# Patient Record
Sex: Female | Born: 1955 | Race: White | Hispanic: No | Marital: Single | Smoking: Current every day smoker
Health system: Southern US, Community
[De-identification: ages and names within clinical notes are randomized; demographics above are authoritative.]

## PROBLEM LIST (undated history)

## (undated) DIAGNOSIS — T7840XA Allergy, unspecified, initial encounter: Secondary | ICD-10-CM

## (undated) DIAGNOSIS — Z9189 Other specified personal risk factors, not elsewhere classified: Secondary | ICD-10-CM

## (undated) DIAGNOSIS — F32A Depression, unspecified: Secondary | ICD-10-CM

## (undated) DIAGNOSIS — K219 Gastro-esophageal reflux disease without esophagitis: Secondary | ICD-10-CM

## (undated) HISTORY — DX: Allergy, unspecified, initial encounter: T78.40XA

## (undated) HISTORY — DX: Other specified personal risk factors, not elsewhere classified: Z91.89

## (undated) HISTORY — DX: Gastro-esophageal reflux disease without esophagitis: K21.9

## (undated) HISTORY — PX: NO PAST SURGERIES: SHX2092

## (undated) HISTORY — DX: Depression, unspecified: F32.A

---

## 1999-07-31 ENCOUNTER — Emergency Department (HOSPITAL_COMMUNITY): Admission: EM | Admit: 1999-07-31 | Discharge: 1999-07-31 | Payer: Self-pay | Admitting: Emergency Medicine

## 1999-10-09 ENCOUNTER — Emergency Department (HOSPITAL_COMMUNITY): Admission: EM | Admit: 1999-10-09 | Discharge: 1999-10-09 | Payer: Self-pay | Admitting: Emergency Medicine

## 2007-08-02 ENCOUNTER — Encounter: Admission: RE | Admit: 2007-08-02 | Discharge: 2007-08-02 | Payer: Self-pay | Admitting: Orthopedic Surgery

## 2009-02-11 ENCOUNTER — Emergency Department (HOSPITAL_COMMUNITY): Admission: EM | Admit: 2009-02-11 | Discharge: 2009-02-11 | Payer: Self-pay | Admitting: Emergency Medicine

## 2009-02-17 ENCOUNTER — Encounter: Admission: RE | Admit: 2009-02-17 | Discharge: 2009-02-17 | Payer: Self-pay | Admitting: Internal Medicine

## 2010-01-27 IMAGING — RF DG UGI W/ HIGH DENSITY W/KUB
19 of 24 series · 19 of 24 positions shown · non-contrast
Comparison: [HOSPITAL] chest x-ray 02/11/2009.

CLINICAL DATA: Abdominal pain. Bright blood in stool alternating
with very dark stool.  Constipation.  Post prandial chest
pain/pressure.  Weight loss 10-12 pounds in 1 month.  Nausea.

UPPER GI SERIES W/HIGH DENSITY W/KUB
TECHNIQUE: After obtaining a scout radiograph, upper GI series
performed with high density barium and effervescent agent. Thin
barium also used. Ingested 13 mm barium tablet with water.
Fluoroscopy Time: 2.7 minutes.

[Series 1: run · 1 of 5 slices shown (1 of 19)]
[im 1/5]
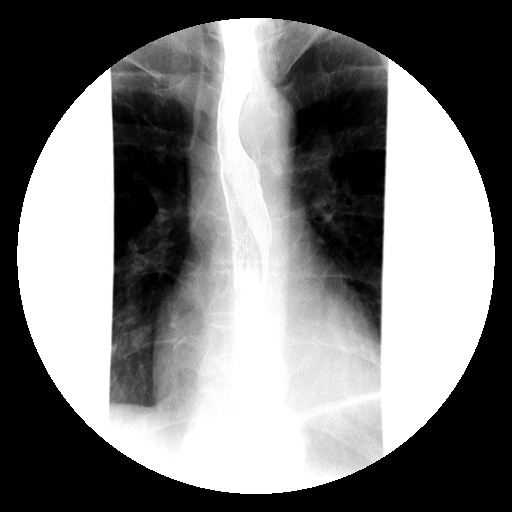

[Series 2: run · 1 of 3 slices shown (2 of 19)]
[im 1/3]
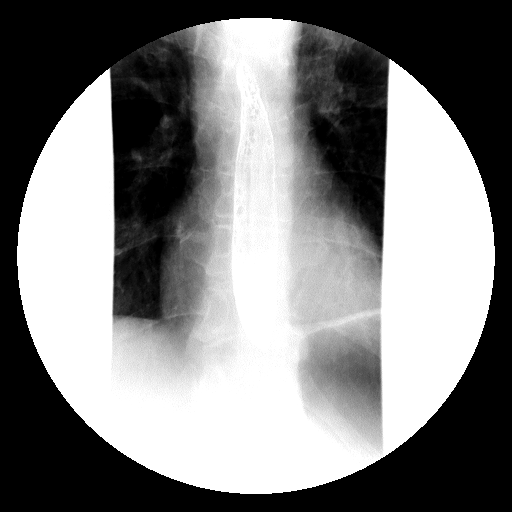

[Series 4: run · 1 of 3 slices shown (3 of 19)]
[im 1/3]
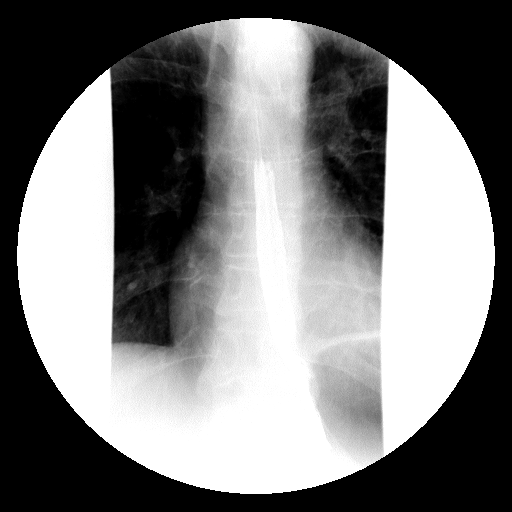

[Series 5: run · 1 of 1 slices shown (4 of 19)]
[im 1/1]
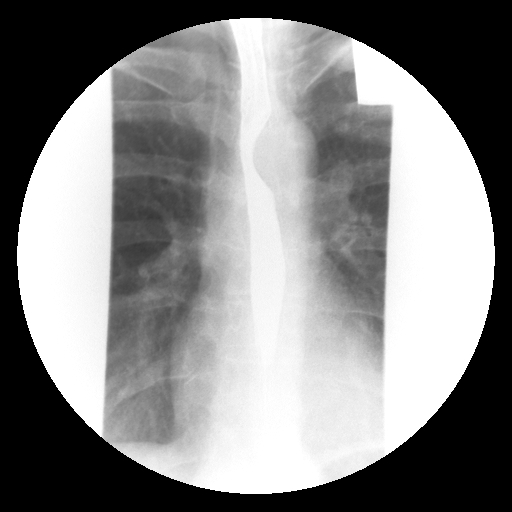

[Series 6: run · 1 of 1 slices shown (5 of 19)]
[im 1/1]
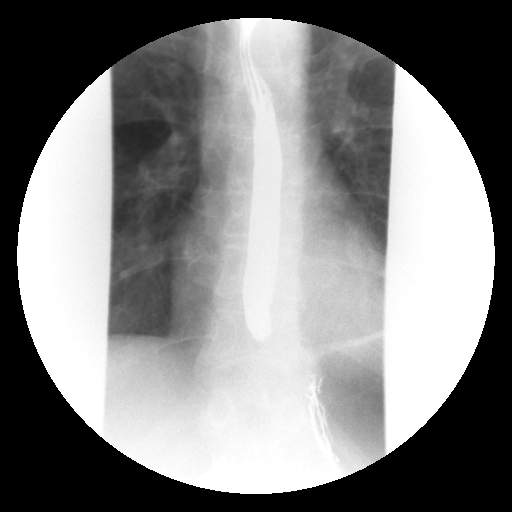

[Series 7: run · 1 of 1 slices shown (6 of 19)]
[im 1/1]
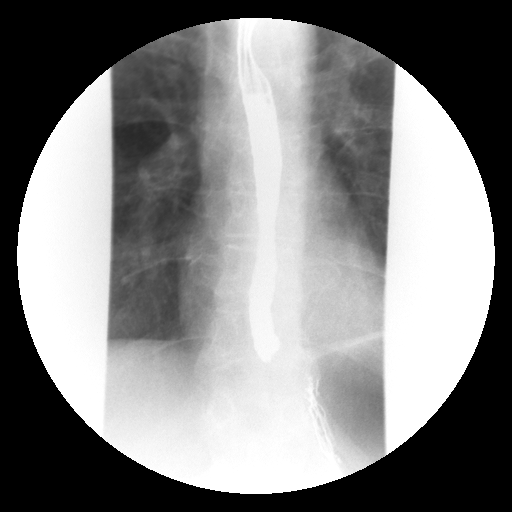

[Series 9: run · 1 of 7 slices shown (7 of 19)]
[im 1/7]
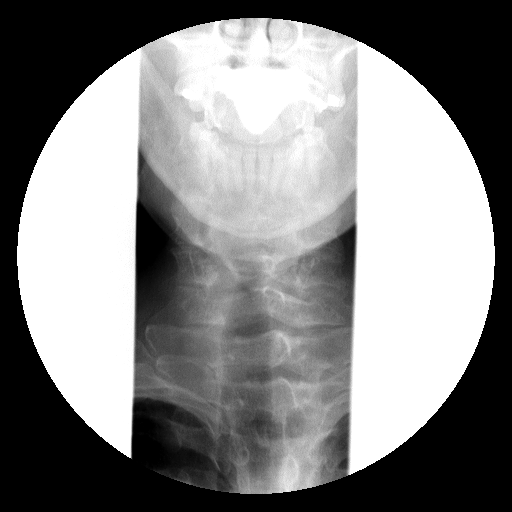

[Series 10: run · 1 of 15 slices shown (8 of 19)]
[im 1/15]
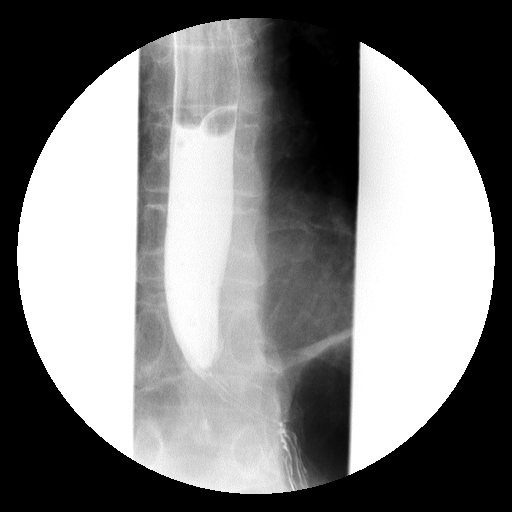

[Series 11: run · 1 of 10 slices shown (9 of 19)]
[im 1/10]
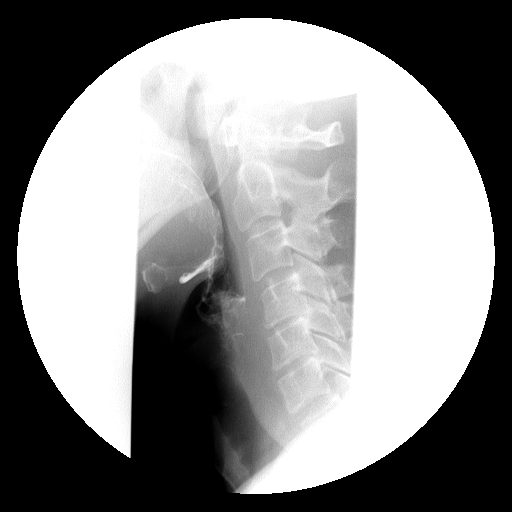

[Series 13: run · 1 of 5 slices shown (10 of 19)]
[im 1/5]
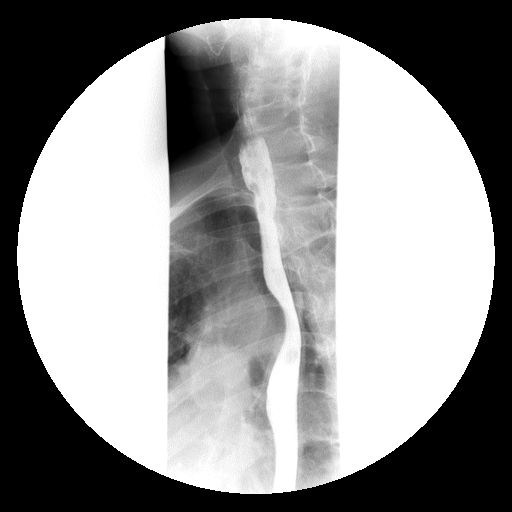

[Series 14: run · 1 of 3 slices shown (11 of 19)]
[im 1/3]
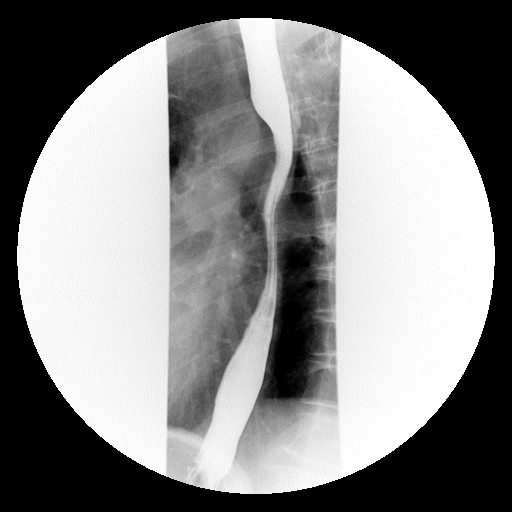

[Series 15: run · 1 of 22 slices shown (12 of 19)]
[im 1/22]
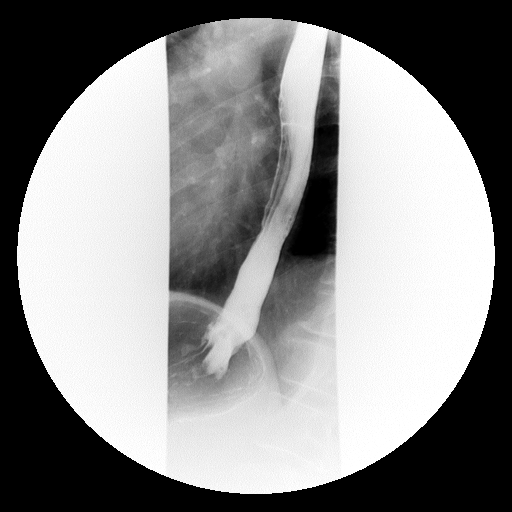

[Series 16: run · 1 of 1 slices shown (13 of 19)]
[im 1/1]
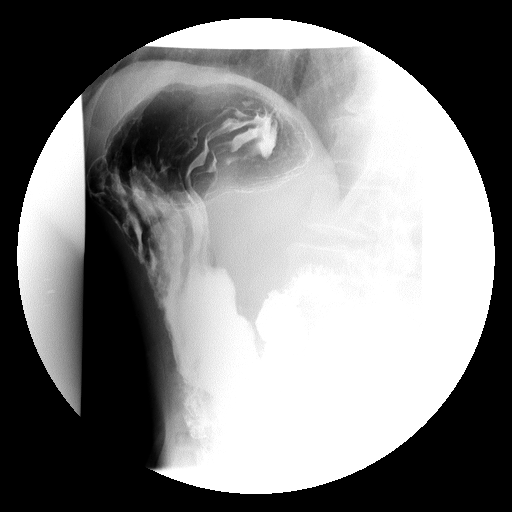

[Series 18: run · 1 of 1 slices shown (14 of 19)]
[im 1/1]
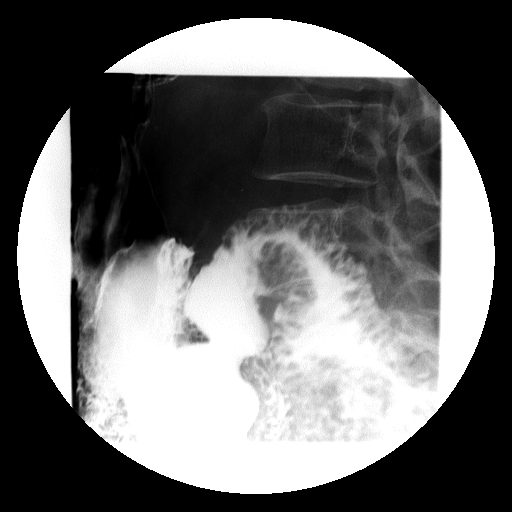

[Series 19: run · 1 of 1 slices shown (15 of 19)]
[im 1/1]
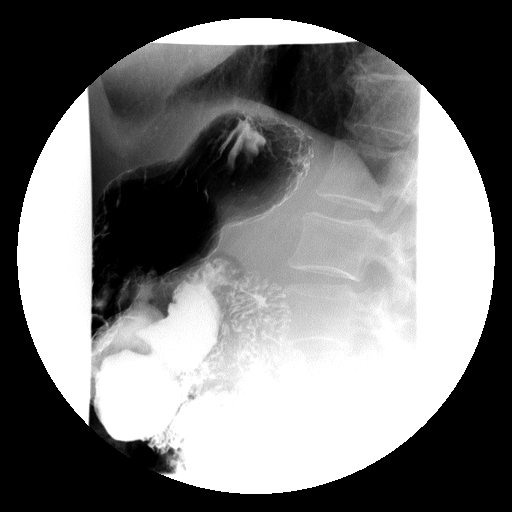

[Series 20: run · 1 of 1 slices shown (16 of 19)]
[im 1/1]
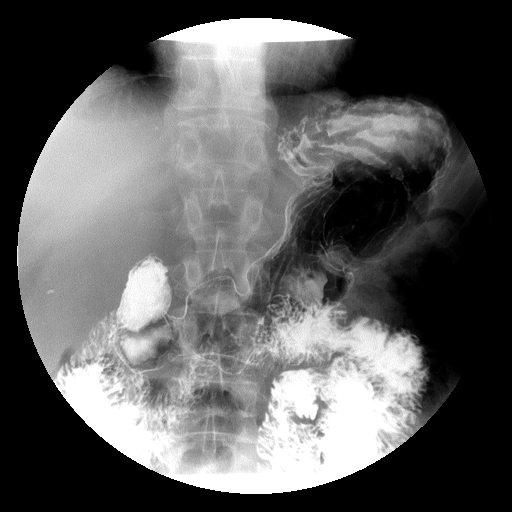

[Series 21: run · 1 of 1 slices shown (17 of 19)]
[im 1/1]
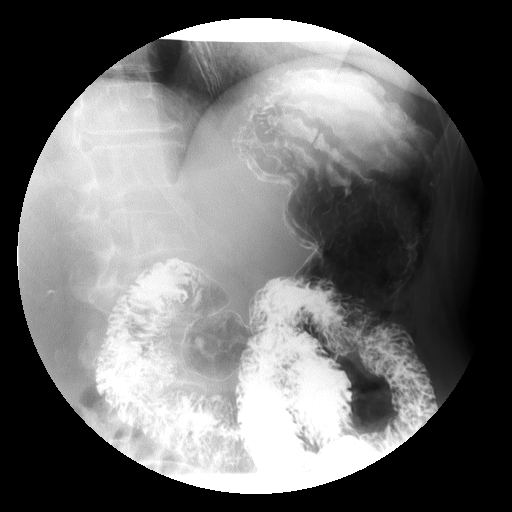

[Series 23: run · 1 of 1 slices shown (18 of 19)]
[im 1/1]
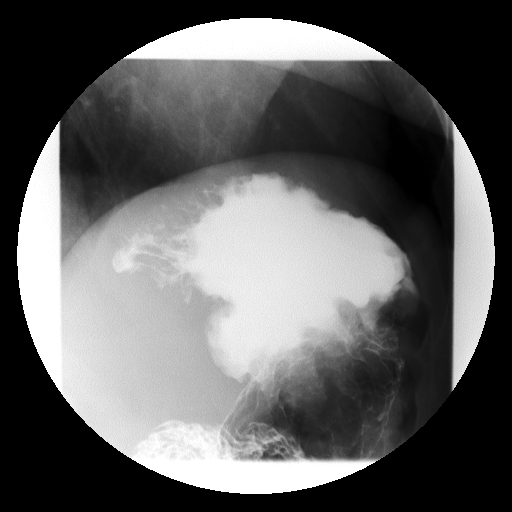

[Series 24: run · 1 of 1 slices shown (19 of 19)]
[im 1/1]
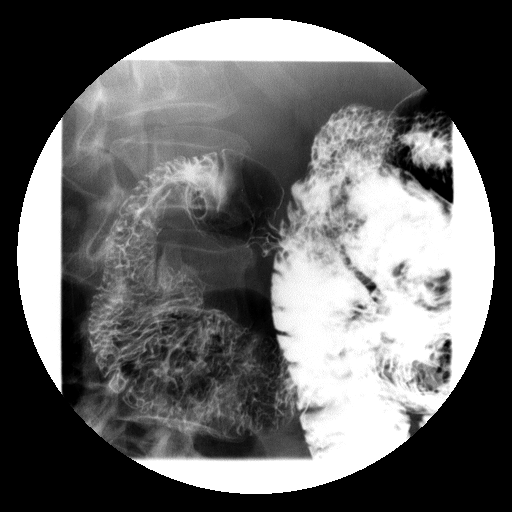

[19 of 24 positions shown; findings below may reference images not displayed]

FINDINGS: Normal antegrade peristalsis is seen through the cervical
thoracic esophagus with no intrinsic or extrinsic esophageal
lesion.  Ingested 13 mm barium tablet readily passed into the
stomach.  No spontaneous or induced (Valsalva/water siphon)
gastroesophageal reflux demonstrated.  The stomach, duodenum, and
proximal loops of jejunum appear normal with prompt egress of
barium through structures.  Moderate retained colonic feces
especially left colon seen with normal bowel gas pattern on scout
view.
IMPRESSION: 1.  Normal upper GI series.
2.  Moderate retained left colonic feces may represent slight
constipation.  Need clinical correlation.

## 2010-12-11 ENCOUNTER — Encounter: Payer: Self-pay | Admitting: Internal Medicine

## 2011-03-02 LAB — CBC
HCT: 40.6 % (ref 36.0–46.0)
MCHC: 34.5 g/dL (ref 30.0–36.0)
MCV: 99.9 fL (ref 78.0–100.0)
Platelets: 156 10*3/uL (ref 150–400)
WBC: 3.5 10*3/uL — ABNORMAL LOW (ref 4.0–10.5)

## 2011-03-02 LAB — DIFFERENTIAL
Basophils Relative: 1 % (ref 0–1)
Eosinophils Absolute: 0 10*3/uL (ref 0.0–0.7)
Eosinophils Relative: 1 % (ref 0–5)
Lymphs Abs: 1.4 10*3/uL (ref 0.7–4.0)
Neutrophils Relative %: 51 % (ref 43–77)

## 2011-03-02 LAB — COMPREHENSIVE METABOLIC PANEL
ALT: 13 U/L (ref 0–35)
AST: 18 U/L (ref 0–37)
Albumin: 3.6 g/dL (ref 3.5–5.2)
Alkaline Phosphatase: 46 U/L (ref 39–117)
CO2: 23 mEq/L (ref 19–32)
Chloride: 110 mEq/L (ref 96–112)
Creatinine, Ser: 1.11 mg/dL (ref 0.4–1.2)
GFR calc Af Amer: >60 mL/min (ref >60)
GFR calc non Af Amer: 52 mL/min — ABNORMAL LOW (ref >60)
Potassium: 4.3 mEq/L (ref 3.5–5.1)
Sodium: 141 mEq/L (ref 135–145)
Total Bilirubin: 0.3 mg/dL (ref 0.3–1.2)

## 2011-03-02 LAB — POCT CARDIAC MARKERS

## 2011-03-23 ENCOUNTER — Emergency Department (HOSPITAL_COMMUNITY)
Admission: EM | Admit: 2011-03-23 | Discharge: 2011-03-23 | Disposition: A | Discharge status: Home / Self Care | Payer: Self-pay | Attending: Emergency Medicine | Admitting: Emergency Medicine

## 2011-03-23 ENCOUNTER — Emergency Department (HOSPITAL_COMMUNITY): Payer: Self-pay

## 2011-03-23 DIAGNOSIS — R002 Palpitations: Secondary | ICD-10-CM | POA: Insufficient documentation

## 2011-03-23 DIAGNOSIS — R5381 Other malaise: Secondary | ICD-10-CM | POA: Insufficient documentation

## 2011-03-23 DIAGNOSIS — R64 Cachexia: Secondary | ICD-10-CM | POA: Insufficient documentation

## 2011-03-23 DIAGNOSIS — R634 Abnormal weight loss: Secondary | ICD-10-CM | POA: Insufficient documentation

## 2011-03-23 DIAGNOSIS — R109 Unspecified abdominal pain: Secondary | ICD-10-CM | POA: Insufficient documentation

## 2011-03-23 DIAGNOSIS — F329 Major depressive disorder, single episode, unspecified: Secondary | ICD-10-CM | POA: Insufficient documentation

## 2011-03-23 DIAGNOSIS — F17200 Nicotine dependence, unspecified, uncomplicated: Secondary | ICD-10-CM | POA: Insufficient documentation

## 2011-03-23 DIAGNOSIS — D696 Thrombocytopenia, unspecified: Secondary | ICD-10-CM | POA: Insufficient documentation

## 2011-03-23 DIAGNOSIS — K219 Gastro-esophageal reflux disease without esophagitis: Secondary | ICD-10-CM | POA: Insufficient documentation

## 2011-03-23 DIAGNOSIS — R5383 Other fatigue: Secondary | ICD-10-CM | POA: Insufficient documentation

## 2011-03-23 DIAGNOSIS — K589 Irritable bowel syndrome without diarrhea: Secondary | ICD-10-CM | POA: Insufficient documentation

## 2011-03-23 DIAGNOSIS — F3289 Other specified depressive episodes: Secondary | ICD-10-CM | POA: Insufficient documentation

## 2011-03-23 LAB — DIFFERENTIAL
Basophils Relative: 1 % (ref 0–1)
Eosinophils Absolute: 0.1 10*3/uL (ref 0.0–0.7)
Neutro Abs: 2 10*3/uL (ref 1.7–7.7)
Neutrophils Relative %: 45 % (ref 43–77)

## 2011-03-23 LAB — URINALYSIS, ROUTINE W REFLEX MICROSCOPIC
Glucose, UA: NEGATIVE mg/dL
Hgb urine dipstick: NEGATIVE
Ketones, ur: NEGATIVE mg/dL
pH: 6 (ref 5.0–8.0)

## 2011-03-23 LAB — URINE MICROSCOPIC-ADD ON

## 2011-03-23 LAB — COMPREHENSIVE METABOLIC PANEL
ALT: 10 U/L (ref 0–35)
AST: 17 U/L (ref 0–37)
Albumin: 3.7 g/dL (ref 3.5–5.2)
Alkaline Phosphatase: 36 U/L — ABNORMAL LOW (ref 39–117)
Chloride: 109 mEq/L (ref 96–112)
Creatinine, Ser: 1.08 mg/dL (ref 0.4–1.2)
GFR calc Af Amer: >60 mL/min (ref >60)
Potassium: 4 mEq/L (ref 3.5–5.1)
Sodium: 141 mEq/L (ref 135–145)
Total Bilirubin: 0.1 mg/dL — ABNORMAL LOW (ref 0.3–1.2)

## 2011-03-23 LAB — CBC
Platelets: 124 10*3/uL — ABNORMAL LOW (ref 150–400)
RBC: 3.91 MIL/uL (ref 3.87–5.11)
WBC: 4.3 10*3/uL (ref 4.0–10.5)

## 2011-05-09 ENCOUNTER — Emergency Department (HOSPITAL_COMMUNITY)
Admission: EM | Admit: 2011-05-09 | Discharge: 2011-05-10 | Disposition: A | Discharge status: Home / Self Care | Payer: Self-pay | Attending: Emergency Medicine | Admitting: Emergency Medicine

## 2011-05-09 DIAGNOSIS — F1320 Sedative, hypnotic or anxiolytic dependence, uncomplicated: Secondary | ICD-10-CM | POA: Insufficient documentation

## 2011-05-09 DIAGNOSIS — F341 Dysthymic disorder: Secondary | ICD-10-CM | POA: Insufficient documentation

## 2011-05-09 LAB — DIFFERENTIAL
Basophils Absolute: 0 10*3/uL (ref 0.0–0.1)
Eosinophils Absolute: 0.1 10*3/uL (ref 0.0–0.7)
Eosinophils Relative: 1 % (ref 0–5)
Lymphocytes Relative: 24 % (ref 12–46)
Monocytes Absolute: 0.3 10*3/uL (ref 0.1–1.0)

## 2011-05-09 LAB — COMPREHENSIVE METABOLIC PANEL
ALT: 12 U/L (ref 0–35)
AST: 17 U/L (ref 0–37)
CO2: 28 mEq/L (ref 19–32)
Calcium: 9.5 mg/dL (ref 8.4–10.5)
Chloride: 104 mEq/L (ref 96–112)
GFR calc Af Amer: >60 mL/min (ref >60)
GFR calc non Af Amer: 56 mL/min — ABNORMAL LOW (ref >60)
Glucose, Bld: 83 mg/dL (ref 70–99)
Sodium: 140 mEq/L (ref 135–145)
Total Bilirubin: 0.2 mg/dL — ABNORMAL LOW (ref 0.3–1.2)

## 2011-05-09 LAB — CBC
HCT: 40.5 % (ref 36.0–46.0)
MCH: 33.6 pg (ref 26.0–34.0)
MCV: 98.5 fL (ref 78.0–100.0)
Platelets: 144 10*3/uL — ABNORMAL LOW (ref 150–400)

## 2011-05-09 LAB — RAPID URINE DRUG SCREEN, HOSP PERFORMED
Barbiturates: NOT DETECTED
Cocaine: NOT DETECTED
Tetrahydrocannabinol: NOT DETECTED

## 2013-07-11 ENCOUNTER — Emergency Department (HOSPITAL_COMMUNITY): Payer: Self-pay

## 2013-07-11 ENCOUNTER — Encounter (HOSPITAL_COMMUNITY): Payer: Self-pay

## 2013-07-11 ENCOUNTER — Emergency Department (HOSPITAL_COMMUNITY)
Admission: EM | Admit: 2013-07-11 | Discharge: 2013-07-11 | Disposition: A | Discharge status: Home / Self Care | Payer: Self-pay | Attending: Emergency Medicine | Admitting: Emergency Medicine

## 2013-07-11 DIAGNOSIS — R296 Repeated falls: Secondary | ICD-10-CM | POA: Insufficient documentation

## 2013-07-11 DIAGNOSIS — Y9389 Activity, other specified: Secondary | ICD-10-CM | POA: Insufficient documentation

## 2013-07-11 DIAGNOSIS — Z880 Allergy status to penicillin: Secondary | ICD-10-CM | POA: Insufficient documentation

## 2013-07-11 DIAGNOSIS — Y9289 Other specified places as the place of occurrence of the external cause: Secondary | ICD-10-CM | POA: Insufficient documentation

## 2013-07-11 DIAGNOSIS — S42301A Unspecified fracture of shaft of humerus, right arm, initial encounter for closed fracture: Secondary | ICD-10-CM

## 2013-07-11 DIAGNOSIS — S42309A Unspecified fracture of shaft of humerus, unspecified arm, initial encounter for closed fracture: Secondary | ICD-10-CM | POA: Insufficient documentation

## 2013-07-11 NOTE — ED Provider Notes (Signed)
Medical screening examination/treatment/procedure(s) were performed by non-physician practitioner and as supervising physician I was immediately available for consultation/collaboration.  Danaisha Celli M Lenae Wherley, MD 07/11/13 2307 
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
## 2013-10-13 ENCOUNTER — Ambulatory Visit: Payer: Self-pay | Admitting: Physical Therapy

## 2013-10-29 ENCOUNTER — Ambulatory Visit: Payer: Self-pay | Attending: Orthopaedic Surgery | Admitting: Physical Therapy

## 2019-08-25 ENCOUNTER — Other Ambulatory Visit: Payer: Self-pay

## 2019-08-25 DIAGNOSIS — Z20822 Contact with and (suspected) exposure to covid-19: Secondary | ICD-10-CM

## 2019-08-27 LAB — NOVEL CORONAVIRUS, NAA: SARS-CoV-2, NAA: NOT DETECTED

## 2021-02-27 ENCOUNTER — Encounter (HOSPITAL_COMMUNITY): Payer: Self-pay | Admitting: Emergency Medicine

## 2021-02-27 ENCOUNTER — Other Ambulatory Visit: Payer: Self-pay

## 2021-02-27 ENCOUNTER — Emergency Department (HOSPITAL_COMMUNITY)
Admission: EM | Admit: 2021-02-27 | Discharge: 2021-02-27 | Disposition: A | Discharge status: Home / Self Care | Payer: No Typology Code available for payment source | Attending: Emergency Medicine | Admitting: Emergency Medicine

## 2021-02-27 ENCOUNTER — Emergency Department (HOSPITAL_COMMUNITY): Payer: No Typology Code available for payment source

## 2021-02-27 DIAGNOSIS — R911 Solitary pulmonary nodule: Secondary | ICD-10-CM | POA: Insufficient documentation

## 2021-02-27 DIAGNOSIS — F17210 Nicotine dependence, cigarettes, uncomplicated: Secondary | ICD-10-CM | POA: Diagnosis not present

## 2021-02-27 DIAGNOSIS — S060X0A Concussion without loss of consciousness, initial encounter: Secondary | ICD-10-CM | POA: Diagnosis not present

## 2021-02-27 DIAGNOSIS — S299XXA Unspecified injury of thorax, initial encounter: Secondary | ICD-10-CM | POA: Insufficient documentation

## 2021-02-27 DIAGNOSIS — Y92410 Unspecified street and highway as the place of occurrence of the external cause: Secondary | ICD-10-CM | POA: Diagnosis not present

## 2021-02-27 DIAGNOSIS — S0990XA Unspecified injury of head, initial encounter: Secondary | ICD-10-CM | POA: Diagnosis present

## 2021-02-27 LAB — CBC WITH DIFFERENTIAL/PLATELET
Abs Immature Granulocytes: 0.02 10*3/uL (ref 0.00–0.07)
Basophils Absolute: 0.1 10*3/uL (ref 0.0–0.1)
Basophils Relative: 1 %
Eosinophils Absolute: 0.1 10*3/uL (ref 0.0–0.5)
Eosinophils Relative: 3 %
HCT: 44.2 % (ref 36.0–46.0)
Hemoglobin: 14.6 g/dL (ref 12.0–15.0)
Immature Granulocytes: 0 %
Lymphocytes Relative: 25 %
Lymphs Abs: 1.2 10*3/uL (ref 0.7–4.0)
MCH: 33.3 pg (ref 26.0–34.0)
MCHC: 33 g/dL (ref 30.0–36.0)
MCV: 100.7 fL — ABNORMAL HIGH (ref 80.0–100.0)
Monocytes Absolute: 0.3 10*3/uL (ref 0.1–1.0)
Monocytes Relative: 6 %
Neutro Abs: 3.2 10*3/uL (ref 1.7–7.7)
Neutrophils Relative %: 65 %
Platelets: 156 10*3/uL (ref 150–400)
RBC: 4.39 MIL/uL (ref 3.87–5.11)
RDW: 13.6 % (ref 11.5–15.5)
WBC: 5 10*3/uL (ref 4.0–10.5)
nRBC: 0 % (ref 0.0–0.2)

## 2021-02-27 LAB — COMPREHENSIVE METABOLIC PANEL
ALT: 13 U/L (ref 0–44)
AST: 21 U/L (ref 15–41)
Albumin: 4 g/dL (ref 3.5–5.0)
Alkaline Phosphatase: 43 U/L (ref 38–126)
Anion gap: 10 (ref 5–15)
BUN: 13 mg/dL (ref 8–23)
CO2: 22 mmol/L (ref 22–32)
Calcium: 8.8 mg/dL — ABNORMAL LOW (ref 8.9–10.3)
Chloride: 108 mmol/L (ref 98–111)
Creatinine, Ser: 0.95 mg/dL (ref 0.44–1.00)
GFR, Estimated: >60 mL/min (ref >60)
Glucose, Bld: 86 mg/dL (ref 70–99)
Potassium: 3.8 mmol/L (ref 3.5–5.1)
Sodium: 140 mmol/L (ref 135–145)
Total Bilirubin: 0.4 mg/dL (ref 0.3–1.2)
Total Protein: 6.5 g/dL (ref 6.5–8.1)

## 2021-02-27 LAB — TROPONIN I (HIGH SENSITIVITY): Troponin I (High Sensitivity): 3 ng/L (ref <18)
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
## 2022-04-05 ENCOUNTER — Ambulatory Visit
Admission: RE | Admit: 2022-04-05 | Discharge: 2022-04-05 | Disposition: A | Discharge status: Home / Self Care | Payer: 59 | Source: Ambulatory Visit | Attending: Nurse Practitioner | Admitting: Nurse Practitioner

## 2022-04-05 ENCOUNTER — Ambulatory Visit: Payer: Self-pay

## 2022-04-05 VITALS — BP 106/61 | HR 83 | Temp 98.0°F | Resp 18

## 2022-04-05 DIAGNOSIS — J309 Allergic rhinitis, unspecified: Secondary | ICD-10-CM | POA: Diagnosis not present

## 2022-04-05 MED ORDER — ONDANSETRON 4 MG PO TBDP: 4.0000 mg | ORAL_TABLET | Freq: Three times a day (TID) | ORAL | 0 refills | Status: AC | PRN

## 2022-04-05 MED ORDER — CETIRIZINE HCL 10 MG PO TABS: 10.0000 mg | ORAL_TABLET | Freq: Every day | ORAL | 0 refills | Status: DC

## 2022-04-05 MED ORDER — FLUTICASONE PROPIONATE 50 MCG/ACT NA SUSP: 2.0000 | Freq: Every day | NASAL | 0 refills | Status: AC

## 2022-04-05 NOTE — ED Provider Notes (Signed)
?[facility] ? ? ? ?CSN: [id number] ?Arrival date & time: 04/05/22  1239 ? ? ?  ? ?History   ?Chief Complaint ?Chief Complaint  ?Patient presents with  ? Sore Throat  ?  chest pain, ear pain, weak - Entered by patient  ? Appointment  ?  1300  ? Otalgia  ? ? ?HPI ?Nancy Rasmussen is a 66 y.o. female.  ? ?The patient is a 66 year old female who presents with symptoms.  Symptoms have been present for the past 2 to 3 weeks.  She complains of sore throat, nasal congestion, and ear pain.  Patient reports that she does have a history of seasonal allergies.  She denies fever, chills, cough, headache, or GI symptoms.   ? ?The history is provided by the patient.  ? ?History reviewed. No pertinent past medical history. ? ?There are no problems to display for this patient. ? ? ?History reviewed. No pertinent surgical history. ? ?OB History   ?No obstetric history on file. ?  ? ? ? ?Home Medications   ? ?Prior to Admission medications   ?Medication Sig Start Date End Date Taking? Authorizing Provider  ?cetirizine (ZYRTEC) 10 MG tablet Take 1 tablet (10 mg total) by mouth daily. 04/05/22  Yes Dyneshia Baccam-Warren, Sadie Haber, NP  ?fluticasone (FLONASE) 50 MCG/ACT nasal spray Place 2 sprays into both nostrils daily. 04/05/22  Yes Ilea Hilton-Warren, Sadie Haber, NP  ?ondansetron (ZOFRAN-ODT) 4 MG disintegrating tablet Take 1 tablet (4 mg total) by mouth every 8 (eight) hours as needed for nausea or vomiting. 04/05/22  Yes Netra Postlethwait-Warren, Sadie Haber, NP  ?Ibuprofen (IBU PO) Take 2 tablets by mouth every 8 (eight) hours as needed (pain).    [provider]  ? ? ?Family History ?History reviewed. No pertinent family history. ? ?Social History ?Social History  ? ?Tobacco Use  ? Smoking status: Some Days  ?  Packs/day: 0.50  ?  Types: Cigarettes  ? Smokeless tobacco: Never  ?Vaping Use  ? Vaping Use: Never used  ?Substance Use Topics  ? Alcohol use: No  ? Drug use: Never  ? ? ? ?Allergies   ?Penicillins ? ? ?Review of Systems ?Review of  Systems  ?Constitutional:  Positive for appetite change and fatigue. Negative for activity change.  ?HENT:  Positive for congestion and sore throat.   ?Eyes: Negative.   ?Respiratory: Negative.    ?Cardiovascular: Negative.   ?Gastrointestinal:  Positive for nausea.  ?Skin: Negative.   ?Psychiatric/Behavioral: Negative.    ? ? ?Physical Exam ?Triage Vital Signs ?ED Triage Vitals  ?Enc Vitals Group  ?   BP 04/05/22 1309 106/61  ?   Pulse Rate 04/05/22 1309 83  ?   Resp 04/05/22 1309 18  ?   Temp 04/05/22 1309 98 ?F (36.7 ?C)  ?   Temp Source 04/05/22 1309 Oral  ?   SpO2 04/05/22 1309 98 %  ?   Weight --   ?   Height --   ?   Head Circumference --   ?   Peak Flow --   ?   Pain Score 04/05/22 1307 5  ?   Pain Loc --   ?   Pain Edu? --   ?   Excl. in GC? --   ? ?No data found. ? ?Updated Vital Signs ?BP 106/61 (BP Location: Right Arm)   Pulse 83   Temp 98 ?F (36.7 ?C) (Oral)   Resp 18   SpO2 98%  ? ?Visual Acuity ?Right Eye  Distance:   ?Left Eye Distance:   ?Bilateral Distance:   ? ?Right Eye Near:   ?Left Eye Near:    ?Bilateral Near:    ? ?Physical Exam ?Vitals and nursing note reviewed.  ?Constitutional:   ?   General: She is not in acute distress. ?   Appearance: She is well-developed.  ?HENT:  ?   Head: Normocephalic.  ?   Right Ear: Tympanic membrane and ear canal normal.  ?   Left Ear: Tympanic membrane and ear canal normal.  ?   Nose: Congestion present.  ?   Mouth/Throat:  ?   Mouth: Mucous membranes are moist.  ?   Pharynx: No pharyngeal swelling or posterior oropharyngeal erythema.  ?Eyes:  ?   Conjunctiva/sclera: Conjunctivae normal.  ?Cardiovascular:  ?   Rate and Rhythm: Normal rate and regular rhythm.  ?   Heart sounds: Normal heart sounds.  ?Pulmonary:  ?   Effort: Pulmonary effort is normal. No respiratory distress.  ?   Breath sounds: Normal breath sounds. No stridor. No wheezing, rhonchi or rales.  ?Chest:  ?   Chest wall: No tenderness.  ?Abdominal:  ?   General: Bowel sounds are normal.  ?    Palpations: Abdomen is soft.  ?   Tenderness: There is no abdominal tenderness.  ?Musculoskeletal:  ?   Cervical back: Normal range of motion.  ?Skin: ?   General: Skin is warm and dry.  ?Neurological:  ?   General: No focal deficit present.  ?   Mental Status: She is alert and oriented to person, place, and time.  ?Psychiatric:     ?   Mood and Affect: Mood normal.     ?   Behavior: Behavior normal.  ? ? ? ?UC Treatments / Results  ?Labs ?(all labs ordered are listed, but only abnormal results are displayed) ?Labs Reviewed - No data to display ? ?EKG ? ? ?Radiology ?No results found. ? ?Procedures ?Procedures (including critical care time) ? ?Medications Ordered in UC ?Medications - No data to display ? ?Initial Impression / Assessment and Plan / UC Course  ?I have reviewed the triage vital signs and the nursing notes. ? ?Pertinent labs & imaging results that were available during my care of the patient were reviewed by me and considered in my medical decision making (see chart for details). ? ?The patient is a 66 year old female who presents for allergy symptoms.  Symptoms have been present for the past 2 to 3 weeks.  Her vital signs are stable, she is in no acute distress.  Her exam is benign for any concern for bacterial etiology.  We will provide the patient with symptomatic treatment.  Advised her that we will give her medicine to help with nausea so she is able to eat.  Patient is scheduled to follow-up with her PCP in June.  Patient advised to attend that appointment as scheduled and follow-up with the primary care physician if her symptoms do not improve. ?Final Clinical Impressions(s) / UC Diagnoses  ? ?Final diagnoses:  ?Allergic rhinitis, unspecified seasonality, unspecified trigger  ? ? ? ?Discharge Instructions   ? ?  ?Take medication as prescribed. ?Increase fluids and get plenty of rest. ?May continue ibuprofen as needed for pain or discomfort.  Take medication with food and water. ?May also use  normal saline nasal spray to help with nasal congestion. ?Follow-up with your primary care physician on 04/24/2022 as scheduled if symptoms do not improve. ? ? ? ? ?  ED Prescriptions   ? ? Medication Sig Dispense Auth. Provider  ? cetirizine (ZYRTEC) 10 MG tablet Take 1 tablet (10 mg total) by mouth daily. 30 tablet Laurel Harnden-Warren, Sadie Haberhristie J, NP  ? fluticasone (FLONASE) 50 MCG/ACT nasal spray Place 2 sprays into both nostrils daily. 16 g Kaylaann Mountz-Warren, Sadie Haberhristie J, NP  ? ondansetron (ZOFRAN-ODT) 4 MG disintegrating tablet Take 1 tablet (4 mg total) by mouth every 8 (eight) hours as needed for nausea or vomiting. 20 tablet Fritzie Prioleau-Warren, Sadie Haberhristie J, NP  ? ?  ? ?PDMP not reviewed this encounter. ?  ?Abran CantorLeath-Warren, Kyrie Bun J, NP ?04/05/22 1941 ? ?

## 2022-04-05 NOTE — ED Triage Notes (Signed)
Pt reports sore throat, bilateral ear pain, fatigue, nausea, cough, swollen in the back, chest congestion lightheaded x 3 weeks. Pt reports nausea is worse after eating.  ? ?Pt reports she was told is year ago theres she needs to follow up for a "spot in the lungs".  ? ?Pt has an appointment with PCP on 04/24/2022.  ?

## 2022-04-05 NOTE — Discharge Instructions (Addendum)
Take medication as prescribed. ?Increase fluids and get plenty of rest. ?May continue ibuprofen as needed for pain or discomfort.  Take medication with food and water. ?May also use normal saline nasal spray to help with nasal congestion. ?Follow-up with your primary care physician on 04/24/2022 as scheduled if symptoms do not improve. ?

## 2022-04-13 ENCOUNTER — Ambulatory Visit: Payer: Self-pay | Admitting: Nurse Practitioner

## 2022-04-24 ENCOUNTER — Ambulatory Visit (INDEPENDENT_AMBULATORY_CARE_PROVIDER_SITE_OTHER): Payer: 59 | Admitting: Nurse Practitioner

## 2022-04-24 ENCOUNTER — Encounter: Payer: Self-pay | Admitting: Nurse Practitioner

## 2022-04-24 VITALS — BP 118/60 | HR 53 | Temp 97.5°F | Resp 12 | Ht 65.0 in | Wt 115.2 lb

## 2022-04-24 DIAGNOSIS — F419 Anxiety disorder, unspecified: Secondary | ICD-10-CM

## 2022-04-24 DIAGNOSIS — Z0000 Encounter for general adult medical examination without abnormal findings: Secondary | ICD-10-CM

## 2022-04-24 DIAGNOSIS — R5383 Other fatigue: Secondary | ICD-10-CM | POA: Diagnosis not present

## 2022-04-24 DIAGNOSIS — R0789 Other chest pain: Secondary | ICD-10-CM | POA: Diagnosis not present

## 2022-04-24 DIAGNOSIS — Z1211 Encounter for screening for malignant neoplasm of colon: Secondary | ICD-10-CM | POA: Diagnosis not present

## 2022-04-24 DIAGNOSIS — Z1231 Encounter for screening mammogram for malignant neoplasm of breast: Secondary | ICD-10-CM | POA: Diagnosis not present

## 2022-04-24 DIAGNOSIS — Z13820 Encounter for screening for osteoporosis: Secondary | ICD-10-CM

## 2022-04-24 DIAGNOSIS — Z122 Encounter for screening for malignant neoplasm of respiratory organs: Secondary | ICD-10-CM

## 2022-04-24 LAB — COMPREHENSIVE METABOLIC PANEL
ALT: 6 U/L (ref 0–35)
AST: 15 U/L (ref 0–37)
Albumin: 4.1 g/dL (ref 3.5–5.2)
Alkaline Phosphatase: 42 U/L (ref 39–117)
BUN: 12 mg/dL (ref 6–23)
CO2: 26 mEq/L (ref 19–32)
Calcium: 8.8 mg/dL (ref 8.4–10.5)
Chloride: 108 mEq/L (ref 96–112)
Creatinine, Ser: 0.92 mg/dL (ref 0.40–1.20)
GFR: 66.12 mL/min (ref >60.00)
Glucose, Bld: 87 mg/dL (ref 70–99)
Potassium: 4 mEq/L (ref 3.5–5.1)
Sodium: 142 mEq/L (ref 135–145)
Total Bilirubin: 0.3 mg/dL (ref 0.2–1.2)
Total Protein: 6.1 g/dL (ref 6.0–8.3)

## 2022-04-24 LAB — CBC
HCT: 41.1 % (ref 36.0–46.0)
Hemoglobin: 13.8 g/dL (ref 12.0–15.0)
MCHC: 33.5 g/dL (ref 30.0–36.0)
MCV: 100.5 fl — ABNORMAL HIGH (ref 78.0–100.0)
Platelets: 167 10*3/uL (ref 150.0–400.0)
RBC: 4.08 Mil/uL (ref 3.87–5.11)
RDW: 13.1 % (ref 11.5–15.5)
WBC: 3.9 10*3/uL — ABNORMAL LOW (ref 4.0–10.5)

## 2022-04-24 LAB — TSH: TSH: 1.02 u[IU]/mL (ref 0.35–5.50)

## 2022-04-24 LAB — LIPID PANEL
Cholesterol: 256 mg/dL — ABNORMAL HIGH (ref 0–200)
HDL: 63.2 mg/dL (ref >39.00)
LDL Cholesterol: 176 mg/dL — ABNORMAL HIGH (ref 0–99)
NonHDL: 193.2
Total CHOL/HDL Ratio: 4
Triglycerides: 87 mg/dL (ref 0.0–149.0)
VLDL: 17.4 mg/dL (ref 0.0–40.0)

## 2022-04-24 LAB — VITAMIN B12: Vitamin B-12: 147 pg/mL — ABNORMAL LOW (ref 211–911)

## 2022-04-24 NOTE — Patient Instructions (Addendum)
Nice to see you today I will be in touch with the lab results once I have them  Follow up in 6 months for your physical, sooner if you need me  EKG looked good in office

## 2022-04-24 NOTE — Progress Notes (Signed)
New Patient Office Visit  Subjective    Patient ID: Nancy Rasmussen, female    DOB: May 17, 1956  Age: 66 y.o. MRN: [id number]  CC:  Chief Complaint  Patient presents with   Establish Care    Has not had a PCP just went to walk in clinics when needed   Fatigue    Has not felt "well in a long time." And then when she feels like this she gets very anxious   Bloated    Has felt more swollen in the upper abdomen area.    HPI Nancy Rasmussen presents to establish care  Fatigue: States that it has been going on for approx 1 year.  Sleep: states she goes to bed around 11 or 1130-630am. States that she wants to sleep more and snores.   Bloating: has been going on for approx 3-4 months. States she feels it in the mid back and abdomen. States that she also noticed it on her toe. Termed as swelling per the patinet.   Colonoscopy: [facility]: GI breast Dexa: Needs LMP: stopped around age 77   Teatnus shot: refused Covid vaccines: has not gotten Shingles: information given   Outpatient Encounter Medications as of 04/24/2022  Medication Sig   fluticasone (FLONASE) 50 MCG/ACT nasal spray Place 2 sprays into both nostrils daily.   Ibuprofen (IBU PO) Take 2 tablets by mouth every 8 (eight) hours as needed (pain).   ondansetron (ZOFRAN-ODT) 4 MG disintegrating tablet Take 1 tablet (4 mg total) by mouth every 8 (eight) hours as needed for nausea or vomiting. (Patient not taking: Reported on 04/24/2022)   [DISCONTINUED] cetirizine (ZYRTEC) 10 MG tablet Take 1 tablet (10 mg total) by mouth daily.   No facility-administered encounter medications on file as of 04/24/2022.    Past Medical History:  Diagnosis Date   Allergy    Depression    GERD (gastroesophageal reflux disease)    History of fainting spells of unknown cause    as a child    Past Surgical History:  Procedure Laterality Date   NO PAST SURGERIES      Family History  Problem Relation Age of Onset   Hypertension  Mother    Atrial fibrillation Mother    GI Bleed Mother    Iron deficiency Mother    Kidney cancer Mother        has 66 kidney   Glaucoma Father    COPD Father    Heart disease Father        heart bypass   Glaucoma Sister     Social History   Socioeconomic History   Marital status: Single    Spouse name: Not on file   Number of children: 1   Years of education: Not on file   Highest education level: Not on file  Occupational History   Not on file  Tobacco Use   Smoking status: Every Day    Packs/day: 0.50    Years: 30.00    Pack years: 15.00    Types: Cigarettes   Smokeless tobacco: Never   Tobacco comments:    Smokes about 1 pack of cigarettes every 2 days.  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 Negative for chills and fever.  Respiratory:  Negative for cough and shortness of breath.   Cardiovascular:  Negative for chest pain (pressure senationts intermittely daily) and leg swelling.  Gastrointestinal:  Negative for abdominal pain, diarrhea, nausea and vomiting.  Genitourinary:  Negative for dysuria and hematuria.  Neurological:  Negative for dizziness and headaches.  Psychiatric/Behavioral:  Negative for hallucinations and suicidal ideas.        Objective    BP 118/60   Pulse (!) 53   Temp (!) 97.5 F (36.4 C)   Resp 12   Ht 5\' 5"  (1.651 m)   Wt 115 lb 3 oz (52.2 kg)   SpO2 98%   BMI 19.17  kg/m   Physical Exam Vitals and nursing note reviewed.  Constitutional:      Appearance: Normal appearance.  HENT:     Right Ear: Tympanic membrane, ear canal and external ear normal.     Left Ear: Tympanic membrane, ear canal and external ear normal.     Mouth/Throat:     Mouth: Mucous membranes are moist.     Pharynx: Oropharynx is clear.  Eyes:     Extraocular Movements: Extraocular movements intact.     Pupils: Pupils are equal, round, and reactive to light.  Cardiovascular:     Rate and Rhythm: Normal rate and regular rhythm.     Pulses: Normal pulses.     Heart sounds: Normal heart sounds.     Comments: Trace edema Pulmonary:     Effort: Pulmonary effort is normal.     Breath sounds: Normal breath sounds.  Abdominal:     General: Bowel sounds are normal. There is no distension.     Palpations: Abdomen is soft. There is no mass.     Tenderness: There is no abdominal tenderness.     Hernia: No hernia is present.  Musculoskeletal:     Right lower leg: Edema present.     Left lower leg: Edema present.  Neurological:     General: No focal deficit present.     Mental Status: She is alert.     Deep Tendon Reflexes:     Reflex Scores:      Bicep reflexes are 2+ on the right side and 2+ on the left side.      Patellar reflexes are 2+ on the right side and 2+ on the left side.    Comments: Bilateral upper and lower extremity strength 5/5  Psychiatric:        Attention and Perception: Attention normal.        Mood and Affect: Mood is anxious.        Speech: Speech normal.        Behavior: Behavior normal.        Thought Content: Thought content normal.        Cognition and Memory: Cognition normal.        Judgment: Judgment normal.        Assessment & Plan:   Problem List Items Addressed This Visit       Other   Chest pressure - Primary  Likely secondary to anxiety.  EKG within normal limits in office today.  Patient does have history of smoking will refer for  low-dose CT scan as well as check metabolic things today       Relevant Orders   CBC   Comprehensive metabolic panel   Lipid panel   EKG 12-Lead (Completed)   Encounter for medical examination to establish care    Has not been evaluated in extended period of time.       Other fatigue    Could be multifactorial.  Pending labs in office today.  PHQ-9 and GAD-7 both positive in office.  If metabolically okay consider antidepressant or anxiolytic       Relevant Orders   CBC   Comprehensive metabolic panel   Vitamin [id number]   TSH   Anxiety    GAD-7 and PHQ-9 establish in office.  Patient likely has a touch of anxiety and depression.  We will rule out metabolic causes that she has not been seen in extended period of time blood work comes back looking okay consider antidepressant that also works with anxiety       Relevant Orders   TSH   Other Visit Diagnoses     Encounter for screening mammogram for malignant neoplasm of breast       Relevant Orders   MM Digital Screening   Screening for osteoporosis       Relevant Orders   DG Bone Density   Screening for colon cancer       Relevant Orders   Ambulatory referral to Gastroenterology   Screening for lung cancer       Relevant Orders   Ambulatory Referral Lung Cancer Screening [facility]       Return in about 6 months (around 10/24/2022) for CPE.   Romilda Garret, NP

## 2022-04-24 NOTE — Assessment & Plan Note (Signed)
Has not been evaluated in extended period of time.

## 2022-04-24 NOTE — Assessment & Plan Note (Signed)
GAD-7 and PHQ-9 establish in office.  Patient likely has a touch of anxiety and depression.  We will rule out metabolic causes that she has not been seen in extended period of time blood work comes back looking okay consider antidepressant that also works with anxiety

## 2022-04-24 NOTE — Assessment & Plan Note (Signed)
Likely secondary to anxiety.  EKG within normal limits in office today.  Patient does have history of smoking will refer for low-dose CT scan as well as check metabolic things today

## 2022-04-24 NOTE — Assessment & Plan Note (Signed)
Could be multifactorial.  Pending labs in office today.  PHQ-9 and GAD-7 both positive in office.  If metabolically okay consider antidepressant or anxiolytic

## 2022-04-26 ENCOUNTER — Telehealth: Payer: Self-pay | Admitting: Nurse Practitioner

## 2022-04-26 DIAGNOSIS — F419 Anxiety disorder, unspecified: Secondary | ICD-10-CM

## 2022-04-26 MED ORDER — SERTRALINE HCL 25 MG PO TABS: ORAL_TABLET | ORAL | 0 refills | Status: AC

## 2022-04-26 NOTE — Telephone Encounter (Signed)
Lots of times anxiety and depression co-exist. I will start her on zoloft 25 mg that she can take at bed time. If she is doing well with the medication then we will go up to 50mg  after the first two weeks. If she starts having thoughts of harming herself or others she needs to either be seen or let know. This medication can cause some nausea and headaches when she first starts it but should resolve within about 10 days if she experiences it. I need to see her in approx 6 weeks for a medication recheck please

## 2022-04-26 NOTE — Telephone Encounter (Signed)
Left message to call back  

## 2022-04-26 NOTE — Telephone Encounter (Signed)
-----   Message from [facility] V, [place] sent at 04/25/2022  4:11 PM EDT ----- Patient advised. Patient agrees to OTC B12 supplement. Patient agrees to depression/anxiety medication but does not want anything like Xanax type of medication. Patient states she does have anxiety but not depression and would like that word removed from her record. She would like to just try medication that is once daily.

## 2022-04-28 NOTE — Telephone Encounter (Signed)
Left message to call back  

## 2022-05-02 NOTE — Telephone Encounter (Signed)
Left message for patient and her son, Janyth Pupa, to call back to discuss.

## 2022-05-05 NOTE — Telephone Encounter (Signed)
Called patient's daughter in law Bosie Clos and left a message to try and reach patient.

## 2022-05-08 NOTE — Telephone Encounter (Signed)
Letter mailed. Have not been able to get in touch with patient. fyi

## 2024-03-17 ENCOUNTER — Ambulatory Visit: Payer: Self-pay | Admitting: Nurse Practitioner
